# Patient Record
Sex: Female | Born: 1960 | Race: White | Hispanic: No | Marital: Married
Health system: Southern US, Community
[De-identification: ages and names within clinical notes are randomized; demographics above are authoritative.]

---

## 2018-09-17 ENCOUNTER — Other Ambulatory Visit: Payer: Self-pay | Admitting: Physician Assistant

## 2018-09-17 DIAGNOSIS — Z1231 Encounter for screening mammogram for malignant neoplasm of breast: Secondary | ICD-10-CM

## 2018-10-10 ENCOUNTER — Encounter: Payer: Self-pay | Admitting: Radiology

## 2018-10-10 ENCOUNTER — Ambulatory Visit
Admission: RE | Admit: 2018-10-10 | Discharge: 2018-10-10 | Disposition: A | Discharge status: Home / Self Care | Payer: BLUE CROSS/BLUE SHIELD | Source: Ambulatory Visit | Attending: Physician Assistant | Admitting: Physician Assistant

## 2018-10-10 DIAGNOSIS — Z1231 Encounter for screening mammogram for malignant neoplasm of breast: Secondary | ICD-10-CM | POA: Insufficient documentation

## 2019-09-14 ENCOUNTER — Other Ambulatory Visit: Payer: Self-pay | Admitting: Physician Assistant

## 2019-09-14 DIAGNOSIS — Z1231 Encounter for screening mammogram for malignant neoplasm of breast: Secondary | ICD-10-CM

## 2019-10-12 ENCOUNTER — Ambulatory Visit
Admission: RE | Admit: 2019-10-12 | Discharge: 2019-10-12 | Disposition: A | Discharge status: Home / Self Care | Payer: BC Managed Care – PPO | Source: Ambulatory Visit | Attending: Physician Assistant | Admitting: Physician Assistant

## 2019-10-12 DIAGNOSIS — Z1231 Encounter for screening mammogram for malignant neoplasm of breast: Secondary | ICD-10-CM | POA: Diagnosis present

## 2019-10-27 ENCOUNTER — Other Ambulatory Visit: Payer: Self-pay

## 2019-10-27 DIAGNOSIS — Z20822 Contact with and (suspected) exposure to covid-19: Secondary | ICD-10-CM

## 2019-10-29 LAB — NOVEL CORONAVIRUS, NAA: SARS-CoV-2, NAA: NOT DETECTED

## 2019-11-05 ENCOUNTER — Other Ambulatory Visit: Payer: Self-pay

## 2019-11-05 DIAGNOSIS — Z20822 Contact with and (suspected) exposure to covid-19: Secondary | ICD-10-CM

## 2019-11-07 LAB — NOVEL CORONAVIRUS, NAA: SARS-CoV-2, NAA: NOT DETECTED

## 2020-09-21 ENCOUNTER — Other Ambulatory Visit: Payer: Self-pay | Admitting: Physician Assistant

## 2020-09-21 DIAGNOSIS — Z1231 Encounter for screening mammogram for malignant neoplasm of breast: Secondary | ICD-10-CM

## 2020-10-13 ENCOUNTER — Other Ambulatory Visit: Payer: Self-pay

## 2020-10-13 ENCOUNTER — Ambulatory Visit
Admission: RE | Admit: 2020-10-13 | Discharge: 2020-10-13 | Disposition: A | Discharge status: Home / Self Care | Payer: BC Managed Care – PPO | Source: Ambulatory Visit | Attending: Physician Assistant | Admitting: Physician Assistant

## 2020-10-13 DIAGNOSIS — Z1231 Encounter for screening mammogram for malignant neoplasm of breast: Secondary | ICD-10-CM | POA: Diagnosis present

## 2020-12-21 ENCOUNTER — Other Ambulatory Visit: Payer: BC Managed Care – PPO

## 2021-03-29 ENCOUNTER — Other Ambulatory Visit: Admission: RE | Admit: 2021-03-29 | Payer: BC Managed Care – PPO | Source: Ambulatory Visit

## 2021-03-31 ENCOUNTER — Encounter: Admission: RE | Payer: Self-pay | Source: Home / Self Care

## 2021-03-31 ENCOUNTER — Ambulatory Visit: Admission: RE | Admit: 2021-03-31 | Payer: BC Managed Care – PPO | Source: Home / Self Care

## 2021-03-31 SURGERY — COLONOSCOPY WITH PROPOFOL
Anesthesia: General

## 2021-04-05 ENCOUNTER — Other Ambulatory Visit: Payer: BC Managed Care – PPO

## 2021-08-23 ENCOUNTER — Other Ambulatory Visit: Payer: Self-pay | Admitting: Physician Assistant

## 2021-08-23 DIAGNOSIS — Z1231 Encounter for screening mammogram for malignant neoplasm of breast: Secondary | ICD-10-CM

## 2021-10-16 ENCOUNTER — Ambulatory Visit
Admission: RE | Admit: 2021-10-16 | Discharge: 2021-10-16 | Disposition: A | Discharge status: Home / Self Care | Payer: BC Managed Care – PPO | Source: Ambulatory Visit | Attending: Physician Assistant | Admitting: Physician Assistant

## 2021-10-16 ENCOUNTER — Other Ambulatory Visit: Payer: Self-pay

## 2021-10-16 DIAGNOSIS — Z1231 Encounter for screening mammogram for malignant neoplasm of breast: Secondary | ICD-10-CM | POA: Insufficient documentation

## 2021-10-25 ENCOUNTER — Ambulatory Visit: Admit: 2021-10-25 | Payer: BC Managed Care – PPO | Admitting: Internal Medicine

## 2021-10-25 SURGERY — COLONOSCOPY WITH PROPOFOL
Anesthesia: General

## 2022-04-17 IMAGING — MG MM DIGITAL SCREENING BILAT W/ TOMO AND CAD
8 series · 8 of 24 positions shown · non-contrast
Comparison: Previous exam(s).

CLINICAL DATA: Screening.

EXAM:
DIGITAL SCREENING BILATERAL MAMMOGRAM WITH TOMOSYNTHESIS AND CAD
TECHNIQUE: Bilateral screening digital craniocaudal and mediolateral oblique
mammograms were obtained. Bilateral screening digital breast
tomosynthesis was performed. The images were evaluated with
computer-aided detection.

[R CC synth-2D]
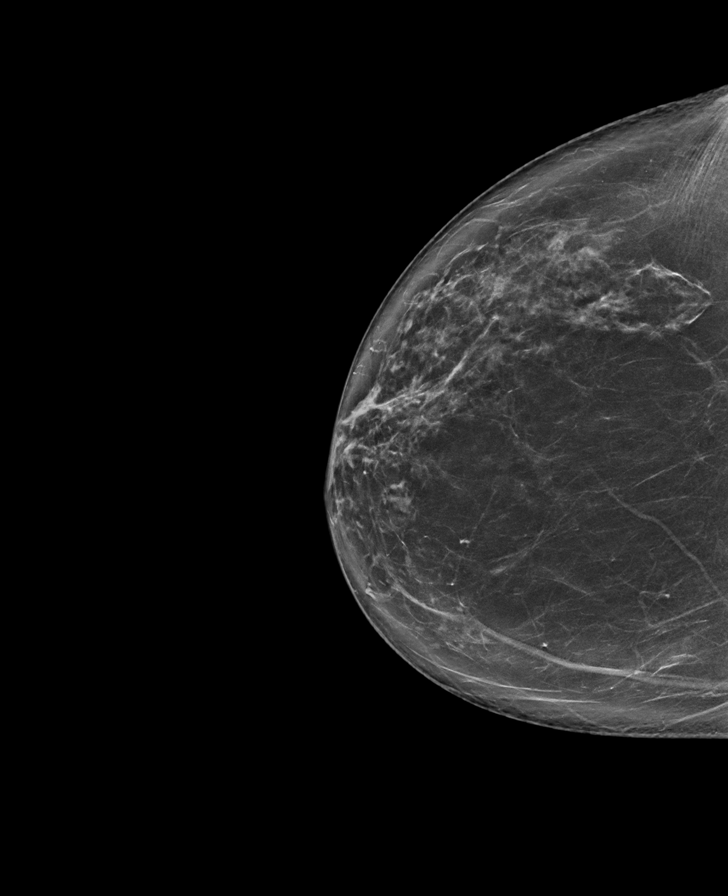

[L CC synth-2D]
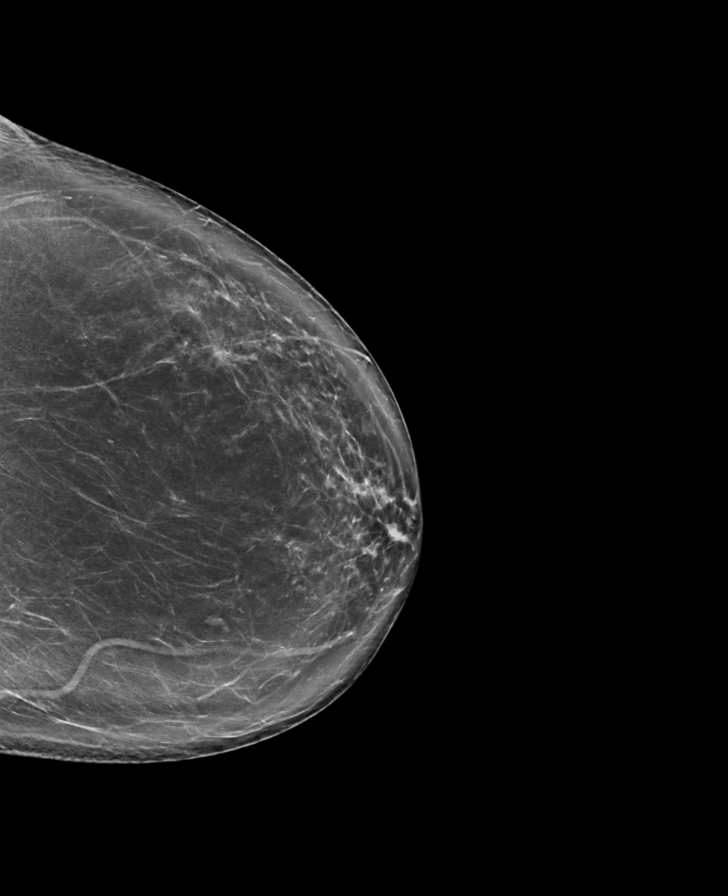

[L MLO synth-2D]
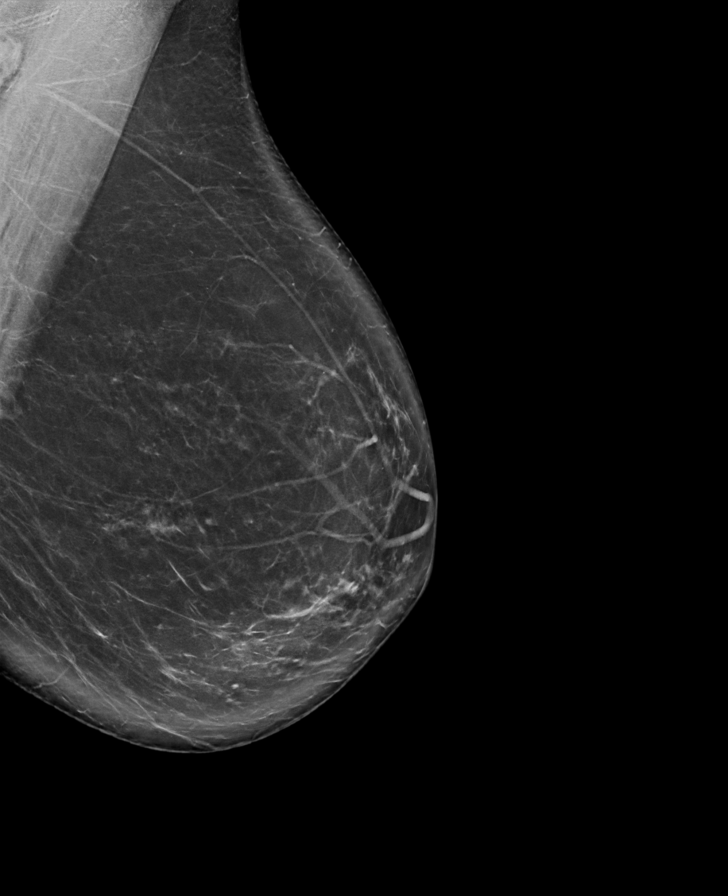

[R MLO synth-2D]
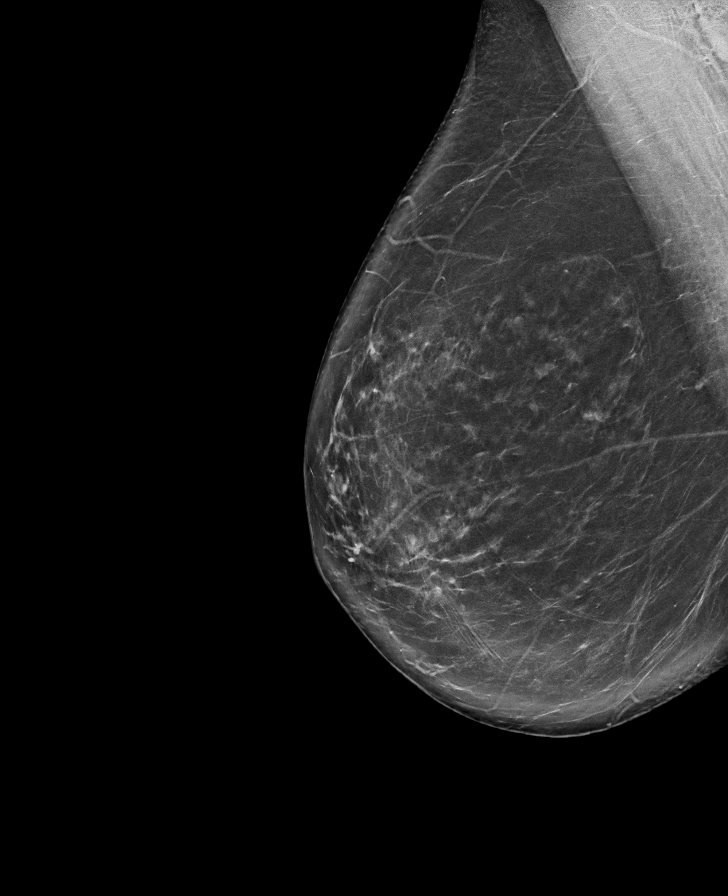

[L CC tomo · tomo slice 47/94.0]
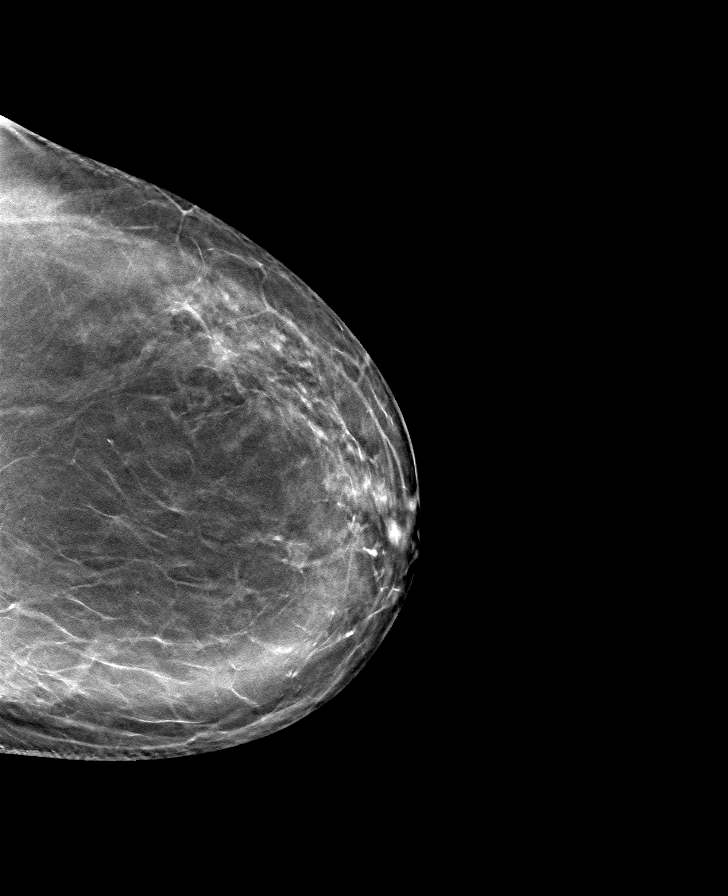

[R MLO tomo · tomo slice 47/93.0]
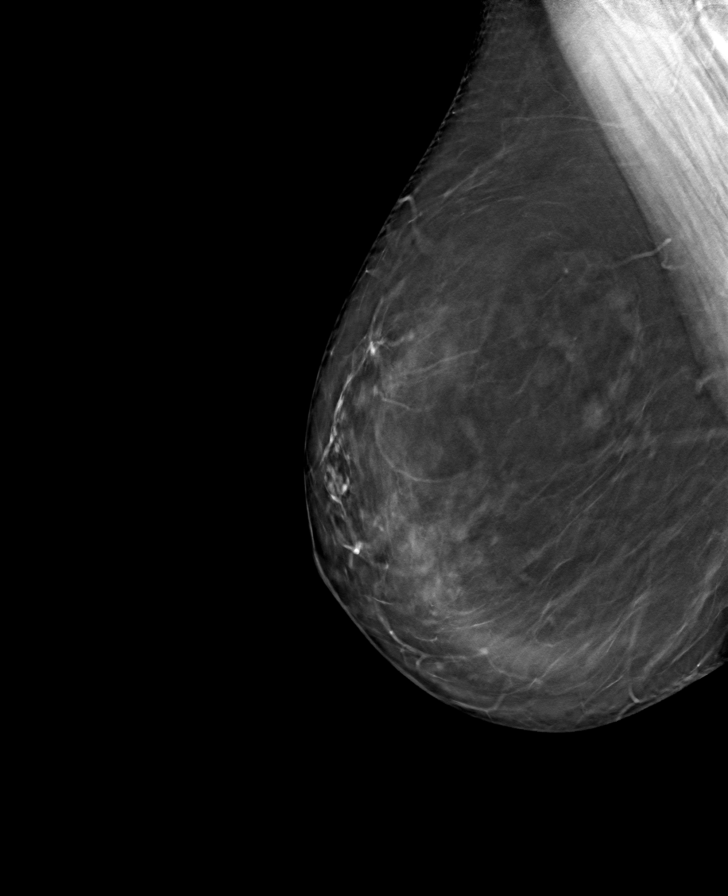

[L MLO tomo · tomo slice 47/94.0]
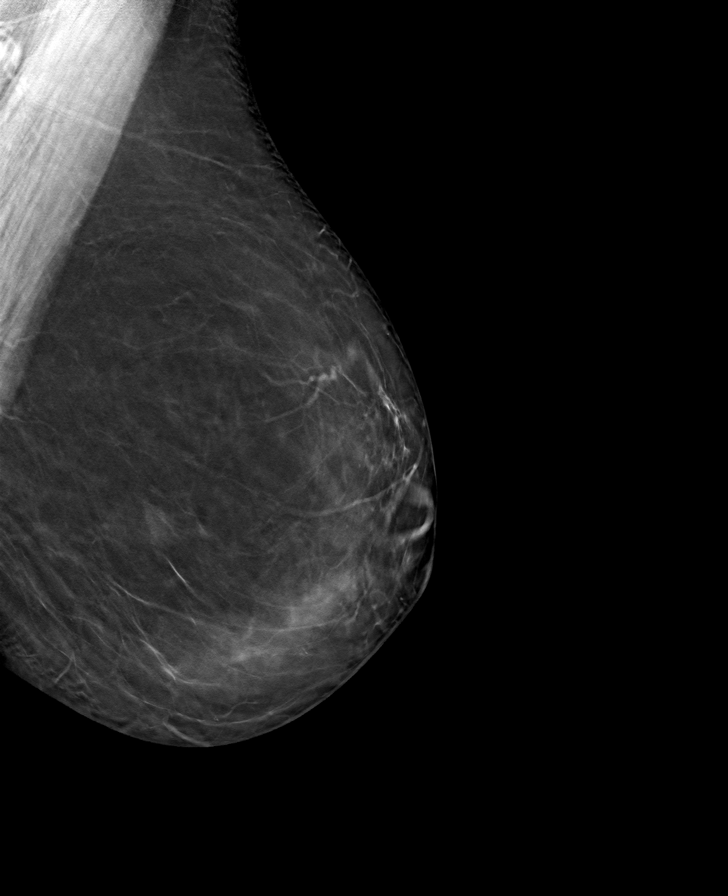

[R CC tomo · tomo slice 45/90.0]
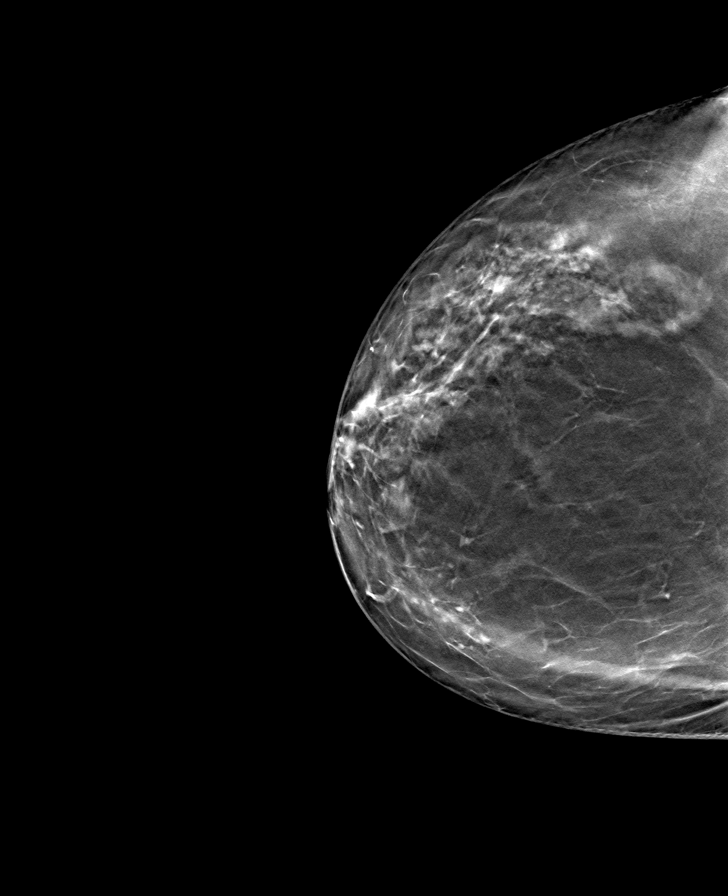

[8 of 24 positions shown; findings below may reference images not displayed]

ACR Breast Density Category b: There are scattered areas of
fibroglandular density.
FINDINGS: There are no findings suspicious for malignancy.
IMPRESSION: No mammographic evidence of malignancy. A result letter of this
screening mammogram will be mailed directly to the patient.

RECOMMENDATION:
Screening mammogram in one year. (Code:51-O-LD2)

BI-RADS CATEGORY  1: Negative.

## 2022-04-25 ENCOUNTER — Other Ambulatory Visit: Payer: Self-pay | Admitting: Physician Assistant

## 2022-04-25 DIAGNOSIS — Z1231 Encounter for screening mammogram for malignant neoplasm of breast: Secondary | ICD-10-CM

## 2022-07-20 ENCOUNTER — Emergency Department: Payer: BC Managed Care – PPO

## 2022-07-20 ENCOUNTER — Encounter: Payer: Self-pay | Admitting: Emergency Medicine

## 2022-07-20 ENCOUNTER — Emergency Department
Admission: EM | Admit: 2022-07-20 | Discharge: 2022-07-20 | Disposition: A | Discharge status: Home / Self Care | Payer: BC Managed Care – PPO | Attending: Emergency Medicine | Admitting: Emergency Medicine

## 2022-07-20 ENCOUNTER — Emergency Department (HOSPITAL_COMMUNITY): Payer: BC Managed Care – PPO

## 2022-07-20 DIAGNOSIS — W1839XA Other fall on same level, initial encounter: Secondary | ICD-10-CM | POA: Insufficient documentation

## 2022-07-20 DIAGNOSIS — F10120 Alcohol abuse with intoxication, uncomplicated: Secondary | ICD-10-CM | POA: Diagnosis not present

## 2022-07-20 DIAGNOSIS — S0990XA Unspecified injury of head, initial encounter: Secondary | ICD-10-CM | POA: Diagnosis present

## 2022-07-20 DIAGNOSIS — Y92009 Unspecified place in unspecified non-institutional (private) residence as the place of occurrence of the external cause: Secondary | ICD-10-CM | POA: Insufficient documentation

## 2022-07-20 DIAGNOSIS — S0101XA Laceration without foreign body of scalp, initial encounter: Secondary | ICD-10-CM | POA: Diagnosis not present

## 2022-07-20 DIAGNOSIS — Z23 Encounter for immunization: Secondary | ICD-10-CM | POA: Insufficient documentation

## 2022-07-20 DIAGNOSIS — F10920 Alcohol use, unspecified with intoxication, uncomplicated: Secondary | ICD-10-CM

## 2022-07-20 MED ORDER — TETANUS-DIPHTH-ACELL PERTUSSIS 5-2.5-18.5 LF-MCG/0.5 IM SUSY
0.5000 mL | PREFILLED_SYRINGE | Freq: Once | INTRAMUSCULAR | Status: AC
  Administered 2022-07-20: 0.5 mL via INTRAMUSCULAR
  Filled 2022-07-20: qty 0.5

## 2022-07-20 MED ORDER — ONDANSETRON HCL 4 MG/2ML IJ SOLN
4.0000 mg | Freq: Once | INTRAMUSCULAR | Status: AC
  Administered 2022-07-20: 4 mg via INTRAVENOUS
  Filled 2022-07-20: qty 2

## 2022-07-20 NOTE — ED Notes (Signed)
CT called to come get patient. Earrings and necklace removed and placed in labeled urine cup and given to husband who is at stretcher.

## 2022-07-20 NOTE — ED Triage Notes (Signed)
Pt was drinking at home and fell. Laceration to back of head. Unknown tetanus booster. Is crying but consolable with husband at bedside. VSS.

## 2022-07-20 NOTE — ED Provider Notes (Signed)
[facility] Provider Note   Event Date/Time   First MD Initiated Contact with Patient 07/20/22 2150     (approximate) History  Alcohol Intoxication (/) and Laceration  HPI Patricia Cain is a 61 y.o. female who presents via EMS after a fall while intoxicated with alcohol earlier tonight.  Patient arrives in tears and not understanding why she is in the emergency department and asking for her husband.  Patient does have a laceration to the right posterior parietal scalp.  Further history and review of systems are unable to be obtained at this time secondary to patient's mental status    Physical Exam  Triage Vital Signs: ED Triage Vitals  Enc Vitals Group     BP      Pulse      Resp      Temp      Temp src      SpO2      Weight      Height      Head Circumference      Peak Flow      Pain Score      Pain Loc      Pain Edu?      Excl. in GC?    Most recent vital signs: Vitals:   07/20/22 2218 07/20/22 2317  BP: 110/82 108/69  Pulse: 88 70  Resp: 18 18  Temp: 98.2 F (36.8 C)   SpO2: 100% 100%   General: Awake, disheveled, tearful CV:  Good peripheral perfusion.  Resp:  Normal effort.  Abd:  No distention.  Other:  Middle-aged overweight Caucasian female laying in bed in moderate emotional distress.  There is a 3 cm linear laceration to the posterior parietal scalp ED Results / Procedures / Treatments   RADIOLOGY ED MD interpretation: CT of the head without contrast interpreted by me shows no evidence of acute abnormalities including no intracerebral hemorrhage, obvious masses, or significant edema  CT of the cervical spine interpreted by me does not show any evidence of acute abnormalities including no acute fracture, malalignment, height loss, or dislocation -Agree with radiology assessment Official radiology report(s): CT Head Wo Contrast  Result Date: 07/20/2022 CLINICAL DATA:  Trauma/fall, ETOH, posterior head laceration EXAM: CT HEAD  WITHOUT CONTRAST CT CERVICAL SPINE WITHOUT CONTRAST TECHNIQUE: Multidetector CT imaging of the head and cervical spine was performed following the standard protocol without intravenous contrast. Multiplanar CT image reconstructions of the cervical spine were also generated. RADIATION DOSE REDUCTION: This exam was performed according to the departmental dose-optimization program which includes automated exposure control, adjustment of the mA and/or kV according to patient size and/or use of iterative reconstruction technique. COMPARISON:  None Available. FINDINGS: CT HEAD FINDINGS Brain: No evidence of acute infarction, hemorrhage, hydrocephalus, extra-axial collection or mass lesion/mass effect. Vascular: No hyperdense vessel or unexpected calcification. Skull: Normal. Negative for fracture or focal lesion. Sinuses/Orbits: The visualized paranasal sinuses are essentially clear. The mastoid air cells are unopacified. Other: None. CT CERVICAL SPINE FINDINGS Alignment: Normal cervical lordosis. Skull base and vertebrae: No acute fracture. No primary bone lesion or focal pathologic process. Soft tissues and spinal canal: No prevertebral fluid or swelling. No visible canal hematoma. Disc levels: Status post C3-6 ACDF, without evidence of complication. Mild degenerative changes of the lower cervical spine. Spinal canal is patent. Upper chest: Visualized lung apices are clear. Other: Visualized thyroid is grossly unremarkable. IMPRESSION: Normal head CT. No evidence of traumatic injury to the cervical spine. Status post C3-6  ACDF, without evidence of complication. Electronically Signed   By: Charline Bills M.D.   On: 07/20/2022 22:40   CT Cervical Spine Wo Contrast  Result Date: 07/20/2022 CLINICAL DATA:  Trauma/fall, ETOH, posterior head laceration EXAM: CT HEAD WITHOUT CONTRAST CT CERVICAL SPINE WITHOUT CONTRAST TECHNIQUE: Multidetector CT imaging of the head and cervical spine was performed following the standard  protocol without intravenous contrast. Multiplanar CT image reconstructions of the cervical spine were also generated. RADIATION DOSE REDUCTION: This exam was performed according to the departmental dose-optimization program which includes automated exposure control, adjustment of the mA and/or kV according to patient size and/or use of iterative reconstruction technique. COMPARISON:  None Available. FINDINGS: CT HEAD FINDINGS Brain: No evidence of acute infarction, hemorrhage, hydrocephalus, extra-axial collection or mass lesion/mass effect. Vascular: No hyperdense vessel or unexpected calcification. Skull: Normal. Negative for fracture or focal lesion. Sinuses/Orbits: The visualized paranasal sinuses are essentially clear. The mastoid air cells are unopacified. Other: None. CT CERVICAL SPINE FINDINGS Alignment: Normal cervical lordosis. Skull base and vertebrae: No acute fracture. No primary bone lesion or focal pathologic process. Soft tissues and spinal canal: No prevertebral fluid or swelling. No visible canal hematoma. Disc levels: Status post C3-6 ACDF, without evidence of complication. Mild degenerative changes of the lower cervical spine. Spinal canal is patent. Upper chest: Visualized lung apices are clear. Other: Visualized thyroid is grossly unremarkable. IMPRESSION: Normal head CT. No evidence of traumatic injury to the cervical spine. Status post C3-6 ACDF, without evidence of complication. Electronically Signed   By: Charline Bills M.D.   On: 07/20/2022 22:40   PROCEDURES: Critical Care performed: No ..Laceration Repair  Date/Time: 07/20/2022 11:18 PM  Performed by: Merwyn Katos, MD Authorized by: Merwyn Katos, MD   Consent:    Consent obtained:  Emergent situation   Consent given by:  Patient   Alternatives discussed:  No treatment Universal protocol:    Immediately prior to procedure, a time out was called: yes     Patient identity confirmed:  Arm band Anesthesia:     Anesthesia method:  None Laceration details:    Location:  Scalp   Scalp location:  R parietal   Length (cm):  3   Depth (mm):  5 Pre-procedure details:    Preparation:  Patient was prepped and draped in usual sterile fashion and imaging obtained to evaluate for foreign bodies Exploration:    Wound exploration: entire depth of wound visualized     Contaminated: no   Treatment:    Area cleansed with:  Povidone-iodine and saline   Amount of cleaning:  Standard   Irrigation volume:  200   Irrigation method:  Syringe   Visualized foreign bodies/material removed: no   Skin repair:    Repair method:  Staples   Number of staples:  3 Approximation:    Approximation:  Close Repair type:    Repair type:  Simple Post-procedure details:    Dressing:  Open (no dressing)  MEDICATIONS ORDERED IN ED: Medications  Tdap (BOOSTRIX) injection 0.5 mL (0.5 mLs Intramuscular Given 07/20/22 2239)  ondansetron (ZOFRAN) injection 4 mg (4 mg Intravenous Given 07/20/22 2313)   IMPRESSION / MDM / ASSESSMENT AND PLAN / ED COURSE  I reviewed the triage vital signs and the nursing notes.                             The patient is on the cardiac monitor to evaluate  for evidence of arrhythmia and/or significant heart rate changes. Patient's presentation is most consistent with acute presentation with potential threat to life or bodily function. Complaining of pain to : The right head  Given history, exam, and workup, low suspicion for ICH, skull fx, spine fx or other acute spinal syndrome, PTX, pulmonary contusion, cardiac contusion, aortic/vertebral dissection, hollow organ injury, acute traumatic abdomen, significant hemorrhage, extremity fracture.  Workup: Imaging: CT brain and c-spine: Negative for any acute abnormalities Defer FAST: vitals WNL, no abdominal tenderness or external signs of trauma, non-severe mechanism Laceration repaired per procedure note  Disposition: Expected transient and self  limiting course for pain discussed with patient. Prompt follow up with primary care physician discussed. Discharge home.   FINAL CLINICAL IMPRESSION(S) / ED DIAGNOSES   Final diagnoses:  Laceration of scalp, initial encounter  Alcoholic intoxication without complication (HCC)   Rx / DC Orders   ED Discharge Orders     None      Note:  This document was prepared using Dragon voice recognition software and may include unintentional dictation errors.   Merwyn Katos, MD 07/20/22 2322

## 2022-07-20 NOTE — ED Notes (Signed)
PT and husband are ubering home.

## 2022-09-21 ENCOUNTER — Ambulatory Visit: Admit: 2022-09-21 | Payer: BC Managed Care – PPO | Admitting: Gastroenterology

## 2022-09-21 SURGERY — COLONOSCOPY WITH PROPOFOL
Anesthesia: General
